# Patient Record
Sex: Male | Born: 1940 | Race: White | Hispanic: No | Marital: Married | Smoking: Never smoker
Health system: Southern US, Community
[De-identification: ages and names within clinical notes are randomized; demographics above are authoritative.]

## PROBLEM LIST (undated history)

## (undated) DIAGNOSIS — E785 Hyperlipidemia, unspecified: Secondary | ICD-10-CM

## (undated) DIAGNOSIS — G2581 Restless legs syndrome: Secondary | ICD-10-CM

## (undated) DIAGNOSIS — K219 Gastro-esophageal reflux disease without esophagitis: Secondary | ICD-10-CM

## (undated) DIAGNOSIS — N400 Enlarged prostate without lower urinary tract symptoms: Secondary | ICD-10-CM

## (undated) DIAGNOSIS — I10 Essential (primary) hypertension: Secondary | ICD-10-CM

## (undated) HISTORY — PX: NASAL FRACTURE SURGERY: SHX718

## (undated) HISTORY — DX: Restless legs syndrome: G25.81

## (undated) HISTORY — PX: OTHER SURGICAL HISTORY: SHX169

## (undated) HISTORY — DX: Essential (primary) hypertension: I10

## (undated) HISTORY — PX: APPENDECTOMY: SHX54

## (undated) HISTORY — DX: Hyperlipidemia, unspecified: E78.5

## (undated) HISTORY — DX: Benign prostatic hyperplasia without lower urinary tract symptoms: N40.0

## (undated) HISTORY — DX: Gastro-esophageal reflux disease without esophagitis: K21.9

---

## 2021-04-05 DIAGNOSIS — Z79899 Other long term (current) drug therapy: Secondary | ICD-10-CM | POA: Diagnosis not present

## 2021-04-05 DIAGNOSIS — E785 Hyperlipidemia, unspecified: Secondary | ICD-10-CM | POA: Diagnosis not present

## 2021-04-05 DIAGNOSIS — Z1212 Encounter for screening for malignant neoplasm of rectum: Secondary | ICD-10-CM | POA: Diagnosis not present

## 2021-04-05 DIAGNOSIS — Z23 Encounter for immunization: Secondary | ICD-10-CM | POA: Diagnosis not present

## 2021-04-05 DIAGNOSIS — K219 Gastro-esophageal reflux disease without esophagitis: Secondary | ICD-10-CM | POA: Diagnosis not present

## 2021-04-05 DIAGNOSIS — G2581 Restless legs syndrome: Secondary | ICD-10-CM | POA: Diagnosis not present

## 2021-04-05 DIAGNOSIS — I10 Essential (primary) hypertension: Secondary | ICD-10-CM | POA: Diagnosis not present

## 2021-04-05 DIAGNOSIS — Z7689 Persons encountering health services in other specified circumstances: Secondary | ICD-10-CM | POA: Diagnosis not present

## 2021-04-05 DIAGNOSIS — Z1211 Encounter for screening for malignant neoplasm of colon: Secondary | ICD-10-CM | POA: Diagnosis not present

## 2021-04-08 DIAGNOSIS — Z23 Encounter for immunization: Secondary | ICD-10-CM | POA: Diagnosis not present

## 2021-06-02 DIAGNOSIS — I10 Essential (primary) hypertension: Secondary | ICD-10-CM | POA: Diagnosis not present

## 2021-07-07 DIAGNOSIS — D3132 Benign neoplasm of left choroid: Secondary | ICD-10-CM | POA: Diagnosis not present

## 2021-07-07 DIAGNOSIS — H33311 Horseshoe tear of retina without detachment, right eye: Secondary | ICD-10-CM | POA: Diagnosis not present

## 2021-07-15 DIAGNOSIS — R42 Dizziness and giddiness: Secondary | ICD-10-CM | POA: Diagnosis not present

## 2021-07-15 DIAGNOSIS — R0982 Postnasal drip: Secondary | ICD-10-CM | POA: Diagnosis not present

## 2021-07-15 DIAGNOSIS — I10 Essential (primary) hypertension: Secondary | ICD-10-CM | POA: Diagnosis not present

## 2021-07-25 DIAGNOSIS — R42 Dizziness and giddiness: Secondary | ICD-10-CM | POA: Diagnosis not present

## 2021-07-25 DIAGNOSIS — H8113 Benign paroxysmal vertigo, bilateral: Secondary | ICD-10-CM | POA: Diagnosis not present

## 2021-08-04 ENCOUNTER — Ambulatory Visit: Payer: Medicare Other | Attending: Internal Medicine | Admitting: Physical Therapy

## 2021-08-04 ENCOUNTER — Other Ambulatory Visit: Payer: Self-pay

## 2021-08-04 DIAGNOSIS — R2681 Unsteadiness on feet: Secondary | ICD-10-CM | POA: Diagnosis not present

## 2021-08-04 DIAGNOSIS — R2689 Other abnormalities of gait and mobility: Secondary | ICD-10-CM | POA: Insufficient documentation

## 2021-08-04 DIAGNOSIS — H8113 Benign paroxysmal vertigo, bilateral: Secondary | ICD-10-CM | POA: Insufficient documentation

## 2021-08-04 DIAGNOSIS — R42 Dizziness and giddiness: Secondary | ICD-10-CM

## 2021-08-05 ENCOUNTER — Encounter: Payer: Self-pay | Admitting: Physical Therapy

## 2021-08-05 NOTE — Therapy (Signed)
[place], [place], [address] Phone: [phone]   Fax:  [phone]  Physical Therapy Evaluation  Patient Details  Name: David Dominguez MRN: [id number] Date of Birth: Oct 26, 1940 Referring Provider (PT): Charlane Ferretti, MD   Encounter Date: 08/04/2021   PT End of Session - 08/05/21 0946     Visit Number 1    Number of Visits 16    Date for PT Re-Evaluation 09/30/21    Authorization Type [employer] Medicare    Progress Note Due on Visit 10    PT Start Time 1405    PT Stop Time 1450    PT Time Calculation (min) 45 min    Activity Tolerance Patient tolerated treatment well    Behavior During Therapy [facility] for tasks assessed/performed             History reviewed. No pertinent past medical history.  History reviewed. No pertinent surgical history.  There were no vitals filed for this visit.    Subjective Assessment - 08/04/21 1405     Subjective Pt reports he was working under his truck laying on his back in July when he became nauseated. A few months after he experienced the same feeling while putting together his bed also in a similar position. He notes it has become more regular now. Pt notes feeling dizzy/sick to his stomach. He has not passed out. Pt notes the world doesn't feel like it's spinning but he can feel a slight headache and nausea. Pt was given exercises by his PCP (states it's mostly laying down with head turning). Pt states he experienced it mostly recently while going down the hall coming into the clinic. He feels it happens mostly while looking up. Pt notes it doesn't happen Dominguez day but depends on activity (for example he felt it when looking back and forth between 2 computer screens)    Pertinent History "vertigo" in the past    Limitations Sitting;Standing;Walking    How long can you sit comfortably? n/a    How long can you stand comfortably? n/a    How long can you walk  comfortably? n/a    Patient Stated Goals Improve nausea and headache feeling    Currently in Pain? No/denies                [facility] PT Assessment - 08/05/21 0001       Assessment   Medical Diagnosis H81.13 (ICD-10-CM) - Benign paroxysmal vertigo, bilateral    Referring Provider (PT) Charlane Ferretti, MD    Onset Date/Surgical Date --   July 2022   Prior Therapy None      Precautions   Precautions Fall      Restrictions   Weight Bearing Restrictions No      Balance Screen   Has the patient fallen in the past 6 months No      [place] residence    Living Arrangements Spouse/significant other    Available Help at Discharge Family      Prior Function   Vocation Retired      Observation/Other Assessments   Focus on Therapeutic Outcomes (FOTO)  n/a      High Level Balance   High Level Balance Comments mCTSIB: condition 1-3: 30 sec. Condition 4 anterior sway touch assist to regain balance ~20 sec  Vestibular Assessment - 08/05/21 0001       Vestibular Assessment   General Observation Reports increased general nausea since coming in through hallway      Symptom Behavior   Subjective history of current problem Pt saw opthamologist and eyes had no issues.    Type of Dizziness  "Funny feeling in head"    Frequency of Dizziness Depends on activity level that day; could be at least 1-3 times during the day    Duration of Dizziness When he quits doing what he's doing it will stop; a few seconds but nausea can linger    Symptom Nature Motion provoked;Positional    Aggravating Factors Looking up to the ceiling;Forward bending;Turning head quickly;Turning body quickly    Relieving Factors Rest;Slow movements    Progression of Symptoms Worse      Oculomotor Exam   Oculomotor Alignment Normal    Ocular ROM WFL    Spontaneous Absent    Gaze-induced  Absent    Head shaking Horizontal Absent    Head Shaking  Vertical Absent    Smooth Pursuits Intact    Saccades Intact      Oculomotor Exam-Fixation Suppressed    Left Head Impulse WNL    Right Head Impulse WNL      Vestibulo-Ocular Reflex   VOR 1 Head Only (x 1 viewing) Increased dizziness with horizontal head turns    VOR 2 Head and Object (x 2 viewing) WNL    VOR to Slow Head Movement Comment   Reports mild dizziness   VOR Cancellation Normal      Positional Testing   Dix-Hallpike Dix-Hallpike Right;Dix-Hallpike Left    Sidelying Test Sidelying Right;Sidelying Left      Dix-Hallpike Right   Dix-Hallpike Right Duration 0      Dix-Hallpike Left   Dix-Hallpike Left Duration 0      Sidelying Right   Sidelying Right Duration 0      Sidelying Left   Sidelying Left Duration 0      Positional Sensitivities   Sit to Supine No dizziness    Supine to Left Side No dizziness    Supine to Right Side No dizziness    Supine to Sitting Lightheadedness    Right Hallpike Lightheadedness    Up from Right Hallpike Lightheadedness    Up from Left Hallpike Lightheadedness    Nose to Right Knee No dizziness    Right Knee to Sitting No dizziness    Nose to Left Knee No dizziness    Left Knee to Sitting No dizziness    Head Turning x 5 No dizziness    Head Nodding x 5 Lightheadedness    Positional Sensitivities Comments Pt reports mild nausea all throughout                Objective measurements completed on examination: See above findings.                PT Education - 08/05/21 0945     Education Details Discussed exam findings, motion sensitivity, and POC. Pt is commuting from [place] -- discussed other clinics that may be more convenient for him such as IT consultant or Neuro rehab on [address].    Person(s) Educated Patient    Methods Explanation;Demonstration;Tactile cues;Verbal cues    Comprehension Verbalized understanding;Returned demonstration;Verbal cues required;Tactile cues required;Need further instruction               PT Short Term Goals - 08/05/21 8341  PT SHORT TERM GOAL #1   Title Pt will be independent with initial HEP    Time 4    Period Weeks    Status New    Target Date 09/02/21      PT SHORT TERM GOAL #2   Title PT will assess FGA and create appropriate goal    Time 4    Period Weeks    Status New    Target Date 09/02/21      PT SHORT TERM GOAL #3   Title Pt will be able to maintain balance on foam with eyes closed x 30 sec to demo improved vestibular integration for mCTSIB    Time 4    Period Weeks    Status New    Target Date 09/02/21      PT SHORT TERM GOAL #4   Title Pt will report at least 50% decrease in his dizziness/nausea    Time 4    Period Weeks    Status New    Target Date 09/02/21               PT Long Term Goals - 08/05/21 1001       PT LONG TERM GOAL #1   Title Pt will be independent with final HEP    Time 8    Period Weeks    Status New    Target Date 09/30/21      PT LONG TERM GOAL #2   Title Pt will demo at least 26/30 on FGA for decreased fall risk    Time 8    Period Weeks    Status New    Target Date 09/30/21      PT LONG TERM GOAL #3   Title Pt will report 0/5 motion sensitivity in the assessed positions on vestibular assessment    Time 8    Period Weeks    Status New    Target Date 09/30/21      PT LONG TERM GOAL #4   Title Pt will report nausea/dizziness has returned to his PLOF    Time 8    Period Weeks    Status New    Target Date 09/30/21                    Plan - 08/05/21 0947     Clinical Impression Statement David Dominguez is an 81 y/o M presenting to OPPT due to complaint of dizziness. Pt reports no prior history of neck pain but does report he had "vertigo" before with a spinning sensation. Does not report spinning but more a sick/nauseous feeling with certain movements. All canalith testing for BPPV was (-) for any nystagmus. Pt demos more motion sensitivity with decreased vestibular  integration with balance and body movement. Pt would highly benefit from PT to address these deficits for return to PLOF. Discussed transfer to [facility] clinic as this may be more convenient for him and his wife.    Personal Factors and Comorbidities Age;Fitness;Time since onset of injury/illness/exacerbation    Examination-Activity Limitations Locomotion Level;Bed Mobility;Bend;Stand;Sit;Transfers    Examination-Participation Restrictions Cleaning;Community Activity;Yard Work;Other    Stability/Clinical Decision Making Stable/Uncomplicated    Clinical Decision Making Low    Rehab Potential Good    PT Frequency 2x / week    PT Duration 8 weeks    PT Treatment/Interventions ADLs/Self Care Home Management;Aquatic Therapy;Electrical Stimulation;Cryotherapy;Iontophoresis 4mg /ml Dexamethasone;Moist Heat;Gait training;Stair training;Functional mobility training;Therapeutic activities;Therapeutic exercise;Balance training;Neuromuscular re-education;Manual techniques;Patient/family education;Dry needling;Passive range of motion;Taping;Vestibular;Spinal  Manipulations    PT Next Visit Plan Please initiate habituation exercises for motion sensitivity (consider Nestor Lewandowsky and Germaine Pomfret), work on oculomotor functions and initiate VOR. Assess FGA if able.    Consulted and Agree with Plan of Care Patient             Patient will benefit from skilled therapeutic intervention in order to improve the following deficits and impairments:  Dizziness, Decreased coordination, Decreased activity tolerance, Decreased balance, Decreased mobility  Visit Diagnosis: Dizziness and giddiness  Unsteadiness on feet  Other abnormalities of gait and mobility     Problem List There are no problems to display for this patient.   [facility] [address], PT, DPT 08/05/2021, 10:06 AM  [facility] [place], [place], [address] Phone:  [phone]   Fax:  [phone]  Name: David Dominguez MRN: [id number] Date of Birth: January 11, 1941

## 2021-08-15 DIAGNOSIS — Z09 Encounter for follow-up examination after completed treatment for conditions other than malignant neoplasm: Secondary | ICD-10-CM | POA: Diagnosis not present

## 2021-09-14 ENCOUNTER — Ambulatory Visit
Admission: RE | Admit: 2021-09-14 | Discharge: 2021-09-14 | Disposition: A | Discharge status: Home / Self Care | Payer: Medicare Other | Source: Ambulatory Visit | Attending: Internal Medicine | Admitting: Internal Medicine

## 2021-09-14 ENCOUNTER — Other Ambulatory Visit: Payer: Self-pay | Admitting: Internal Medicine

## 2021-09-14 DIAGNOSIS — I498 Other specified cardiac arrhythmias: Secondary | ICD-10-CM | POA: Diagnosis not present

## 2021-09-14 DIAGNOSIS — R221 Localized swelling, mass and lump, neck: Secondary | ICD-10-CM | POA: Diagnosis not present

## 2021-09-14 DIAGNOSIS — M47812 Spondylosis without myelopathy or radiculopathy, cervical region: Secondary | ICD-10-CM | POA: Diagnosis not present

## 2021-09-14 DIAGNOSIS — M5412 Radiculopathy, cervical region: Secondary | ICD-10-CM

## 2021-09-14 DIAGNOSIS — M4802 Spinal stenosis, cervical region: Secondary | ICD-10-CM | POA: Diagnosis not present

## 2021-09-14 DIAGNOSIS — R001 Bradycardia, unspecified: Secondary | ICD-10-CM | POA: Diagnosis not present

## 2021-09-14 DIAGNOSIS — R42 Dizziness and giddiness: Secondary | ICD-10-CM | POA: Diagnosis not present

## 2021-09-14 DIAGNOSIS — R200 Anesthesia of skin: Secondary | ICD-10-CM | POA: Diagnosis not present

## 2021-09-30 DIAGNOSIS — I498 Other specified cardiac arrhythmias: Secondary | ICD-10-CM | POA: Diagnosis not present

## 2021-09-30 DIAGNOSIS — M5412 Radiculopathy, cervical region: Secondary | ICD-10-CM | POA: Diagnosis not present

## 2021-09-30 DIAGNOSIS — R001 Bradycardia, unspecified: Secondary | ICD-10-CM | POA: Diagnosis not present

## 2021-10-01 NOTE — Progress Notes (Signed)
?  ?Cardiology Office Note ? ? ?Date:  10/06/2021  ? ?ID:  David Dominguez, DOB Apr 10, 1941, MRN [id number] ? ?PCP:  David Ferretti, MD  ?Cardiologist:   David Nabers Martinique, MD  ? ?Chief Complaint  ?Patient presents with  ? pvc  ? ? ?  ?History of Present Illness: ?David Dominguez is a 81 y.o. male who is seen at the request of David Dominguez for evaluation of ventricular bigeminy. He is a very pleasant male who moved to [facility] from [place] last year. He reports being in good health. Recently he noted pulse rate was 34. Seen by PCP and Ecg showed frequent PVCs with bigeminy. He was completely asymptomatic. No dizziness, palpitations, chest pain, or SOB. He is very active doing yard work. He does note his BP has been fluctuating up and down on his BP monitor. He does have a history of HTN and HLD.  ? ?Past Medical History:  ?Diagnosis Date  ? BPH (benign prostatic hyperplasia)   ? GERD (gastroesophageal reflux disease)   ? Hyperlipidemia   ? Hypertension   ? Restless leg syndrome   ? ? ?Past Surgical History:  ?Procedure Laterality Date  ? APPENDECTOMY    ? esophageal hernia procedure    ? NASAL FRACTURE SURGERY    ? ? ? ?Current Outpatient Medications  ?Medication Sig Dispense Refill  ? amLODipine (NORVASC) 5 MG tablet Take 1 tablet (5 mg total) by mouth daily. 90 tablet 3  ? finasteride (PROSCAR) 5 MG tablet Take 5 mg by mouth daily.    ? gabapentin (NEURONTIN) 100 MG capsule Take 100 mg by mouth 3 (three) times daily.    ? lisinopril-hydrochlorothiazide (ZESTORETIC) 20-25 MG tablet Take 1 tablet by mouth daily.    ? omeprazole (PRILOSEC) 10 MG capsule Take 10 mg by mouth daily.    ? rosuvastatin (CRESTOR) 5 MG tablet Take 5 mg by mouth daily.    ? ?No current facility-administered medications for this visit.  ? ? ?Allergies:   Patient has no allergy information on record.  ? ? ?Social History:  The patient  reports that he has never smoked. He has never used smokeless tobacco. He reports that he does not drink alcohol.  ? ?Family  History:  The patient's family history includes Alzheimer's disease (age of onset: 55) in his father; Heart disease in his brother; Stroke (age of onset: 61) in his mother.  ? ? ?ROS:  Please see the history of present illness.   Otherwise, review of systems are positive for none.   All other systems are reviewed and negative.  ? ? ?PHYSICAL EXAM: ?VS:  BP 134/72   Pulse 60   Ht '5\' 10"'$  (1.778 m)   Wt 220 lb 9.6 oz (100.1 kg)   SpO2 98%   BMI 31.65 kg/m?  , BMI Body mass index is 31.65 kg/m?. ?GEN: Well nourished, well developed, in no acute distress ?HEENT: normal ?Neck: no JVD, carotid bruits, or masses ?Cardiac: RRR; no murmurs, rubs, or gallops,no edema  ?Respiratory:  clear to auscultation bilaterally, normal work of breathing ?GI: soft, nontender, nondistended, + BS ?MS: no deformity or atrophy ?Skin: warm and dry, no rash ?Neuro:  Strength and sensation are intact ?Psych: euthymic mood, full affect ? ? ?EKG:  EKG is ordered today. ?The ekg ordered today demonstrates NSR with occ PVC. Rate 60. Otherwise normal. I have personally reviewed and interpreted this study. ? ? ? ?Recent Labs: ?No results found for requested labs within last  8760 hours.  ? ?Dated 04/05/21: cholesterol 140, triglycerides 64, HDL 68, nonHDL 72. CBC and LFTs normal.  ? ?Lipid Panel ?No results found for: CHOL, TRIG, HDL, CHOLHDL, VLDL, LDLCALC, LDLDIRECT ?  ? ?Wt Readings from Last 3 Encounters:  ?10/06/21 220 lb 9.6 oz (100.1 kg)  ?  ? ? ?Other studies Reviewed: ?Additional studies/ records that were reviewed today include:  ? ?Echo dated 09/30/21: moderate LVH, EF 70%. Mild LAE. 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 Otherwise normal.  ? ? ?ASSESSMENT AND PLAN: ? ?1.  PVCs. Asymptomatic. Normal Electrolytes. Normal LV function. No further evaluation or treatment needed. I think his low pulse rate is a false reading due to undersensing PVCs. This may also explain some variability of BP readings on automatic monitor.  ?2. HTN. I have recommended  increasing amlodipine to 5 mg daily. Continue lisinopril HCT ?3. HLD on statin. ? ? ?Current medicines are reviewed at length with the patient today.  The patient does not have concerns regarding medicines. ? ?The following changes have been made:  increase amlodipine to 5 mg daily ? ?Labs/ tests ordered today include:  ? ?Orders Placed This Encounter  ?Procedures  ? EKG 12-Lead  ? ? ?   ? ? ?Disposition:   FU PRN ? ?Signed, ?David Ober Martinique, MD  ?10/06/2021 1:02 PM    ?[place] ?[address], [place], [place], [address] ?Phone [phone], Fax [phone] ? ? ?

## 2021-10-06 ENCOUNTER — Encounter: Payer: Self-pay | Admitting: Cardiology

## 2021-10-06 ENCOUNTER — Ambulatory Visit: Payer: Medicare Other | Admitting: Cardiology

## 2021-10-06 VITALS — BP 134/72 | HR 60 | Ht 70.0 in | Wt 220.6 lb

## 2021-10-06 DIAGNOSIS — I10 Essential (primary) hypertension: Secondary | ICD-10-CM

## 2021-10-06 DIAGNOSIS — I493 Ventricular premature depolarization: Secondary | ICD-10-CM | POA: Diagnosis not present

## 2021-10-06 MED ORDER — AMLODIPINE BESYLATE 5 MG PO TABS: 5.0000 mg | ORAL_TABLET | Freq: Every day | ORAL | 3 refills | Status: AC

## 2021-11-23 DIAGNOSIS — R21 Rash and other nonspecific skin eruption: Secondary | ICD-10-CM | POA: Diagnosis not present

## 2021-11-23 DIAGNOSIS — L821 Other seborrheic keratosis: Secondary | ICD-10-CM | POA: Diagnosis not present

## 2021-11-23 DIAGNOSIS — L988 Other specified disorders of the skin and subcutaneous tissue: Secondary | ICD-10-CM | POA: Diagnosis not present

## 2021-12-01 DIAGNOSIS — K219 Gastro-esophageal reflux disease without esophagitis: Secondary | ICD-10-CM | POA: Diagnosis not present

## 2021-12-01 DIAGNOSIS — I10 Essential (primary) hypertension: Secondary | ICD-10-CM | POA: Diagnosis not present

## 2021-12-01 DIAGNOSIS — E785 Hyperlipidemia, unspecified: Secondary | ICD-10-CM | POA: Diagnosis not present

## 2021-12-05 DIAGNOSIS — L570 Actinic keratosis: Secondary | ICD-10-CM | POA: Diagnosis not present

## 2021-12-05 DIAGNOSIS — L578 Other skin changes due to chronic exposure to nonionizing radiation: Secondary | ICD-10-CM | POA: Diagnosis not present

## 2021-12-05 DIAGNOSIS — L739 Follicular disorder, unspecified: Secondary | ICD-10-CM | POA: Diagnosis not present

## 2021-12-05 DIAGNOSIS — D485 Neoplasm of uncertain behavior of skin: Secondary | ICD-10-CM | POA: Diagnosis not present

## 2022-01-05 DIAGNOSIS — D3132 Benign neoplasm of left choroid: Secondary | ICD-10-CM | POA: Diagnosis not present

## 2022-01-05 DIAGNOSIS — H33311 Horseshoe tear of retina without detachment, right eye: Secondary | ICD-10-CM | POA: Diagnosis not present

## 2022-01-16 DIAGNOSIS — L300 Nummular dermatitis: Secondary | ICD-10-CM | POA: Diagnosis not present

## 2022-01-16 DIAGNOSIS — B86 Scabies: Secondary | ICD-10-CM | POA: Diagnosis not present

## 2022-01-16 DIAGNOSIS — L309 Dermatitis, unspecified: Secondary | ICD-10-CM | POA: Diagnosis not present

## 2022-01-16 DIAGNOSIS — D485 Neoplasm of uncertain behavior of skin: Secondary | ICD-10-CM | POA: Diagnosis not present

## 2022-02-28 DIAGNOSIS — L820 Inflamed seborrheic keratosis: Secondary | ICD-10-CM | POA: Diagnosis not present

## 2022-02-28 DIAGNOSIS — L570 Actinic keratosis: Secondary | ICD-10-CM | POA: Diagnosis not present

## 2022-03-08 DIAGNOSIS — Z09 Encounter for follow-up examination after completed treatment for conditions other than malignant neoplasm: Secondary | ICD-10-CM | POA: Diagnosis not present

## 2022-03-08 DIAGNOSIS — Z86010 Personal history of colonic polyps: Secondary | ICD-10-CM | POA: Diagnosis not present

## 2022-03-08 DIAGNOSIS — D123 Benign neoplasm of transverse colon: Secondary | ICD-10-CM | POA: Diagnosis not present

## 2022-03-08 DIAGNOSIS — K648 Other hemorrhoids: Secondary | ICD-10-CM | POA: Diagnosis not present

## 2022-03-08 DIAGNOSIS — K5730 Diverticulosis of large intestine without perforation or abscess without bleeding: Secondary | ICD-10-CM | POA: Diagnosis not present

## 2022-03-10 DIAGNOSIS — D123 Benign neoplasm of transverse colon: Secondary | ICD-10-CM | POA: Diagnosis not present

## 2022-05-10 DIAGNOSIS — K219 Gastro-esophageal reflux disease without esophagitis: Secondary | ICD-10-CM | POA: Diagnosis not present

## 2022-05-10 DIAGNOSIS — Z79899 Other long term (current) drug therapy: Secondary | ICD-10-CM | POA: Diagnosis not present

## 2022-05-10 DIAGNOSIS — Z0000 Encounter for general adult medical examination without abnormal findings: Secondary | ICD-10-CM | POA: Diagnosis not present

## 2022-05-10 DIAGNOSIS — E785 Hyperlipidemia, unspecified: Secondary | ICD-10-CM | POA: Diagnosis not present

## 2022-05-10 DIAGNOSIS — G2581 Restless legs syndrome: Secondary | ICD-10-CM | POA: Diagnosis not present

## 2022-05-10 DIAGNOSIS — I10 Essential (primary) hypertension: Secondary | ICD-10-CM | POA: Diagnosis not present

## 2022-05-10 DIAGNOSIS — Z23 Encounter for immunization: Secondary | ICD-10-CM | POA: Diagnosis not present

## 2022-08-07 DIAGNOSIS — R600 Localized edema: Secondary | ICD-10-CM | POA: Diagnosis not present

## 2022-08-23 DIAGNOSIS — Z03818 Encounter for observation for suspected exposure to other biological agents ruled out: Secondary | ICD-10-CM | POA: Diagnosis not present

## 2022-08-23 DIAGNOSIS — R0981 Nasal congestion: Secondary | ICD-10-CM | POA: Diagnosis not present

## 2022-09-25 IMAGING — CR DG CERVICAL SPINE COMP WITH FLEX & EXTEND
7 series · 7 of 7 positions shown · non-contrast
Comparison: None.

CLINICAL DATA: Neck pain, bilateral arm numbness.

EXAM:
CERVICAL SPINE COMPLETE WITH FLEXION AND EXTENSION VIEWS

[w c-spine lat (1 of 3)]
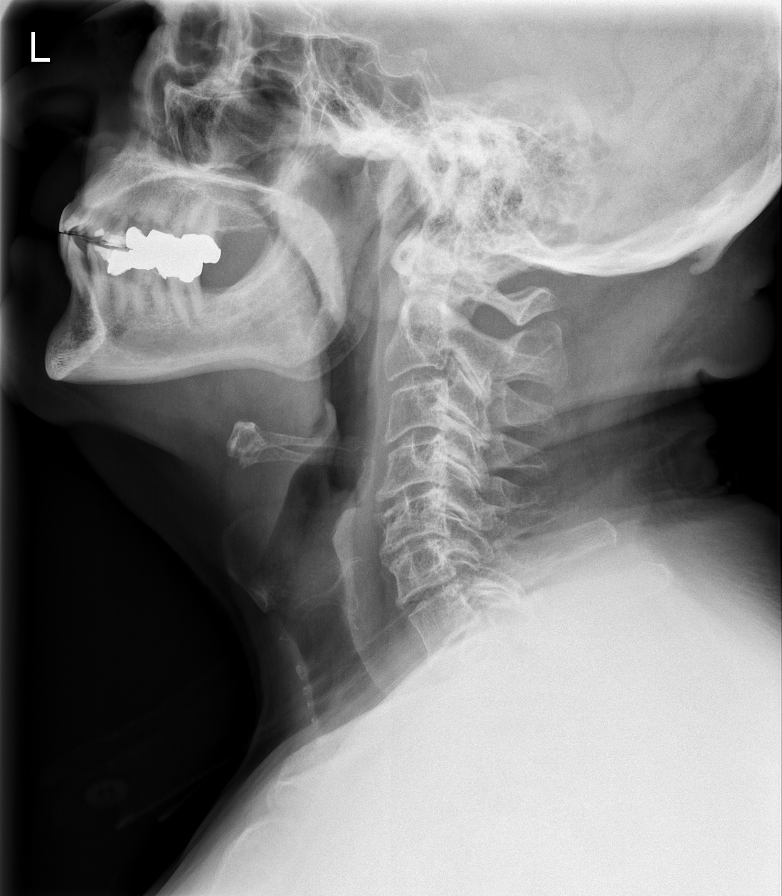

[w c-spine lat (2 of 3)]
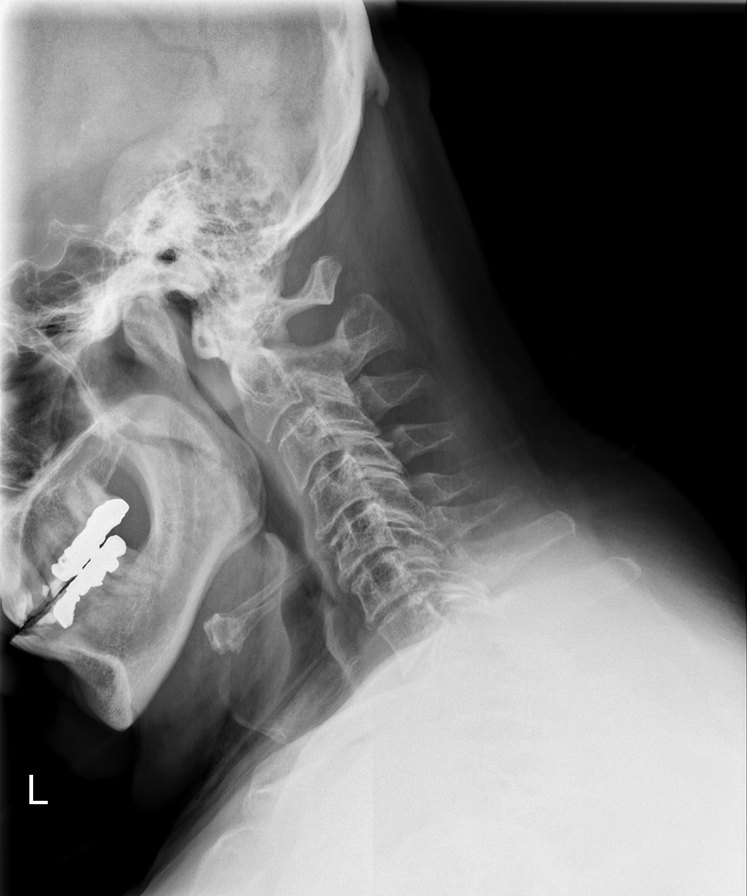

[w c-spine lat (3 of 3)]
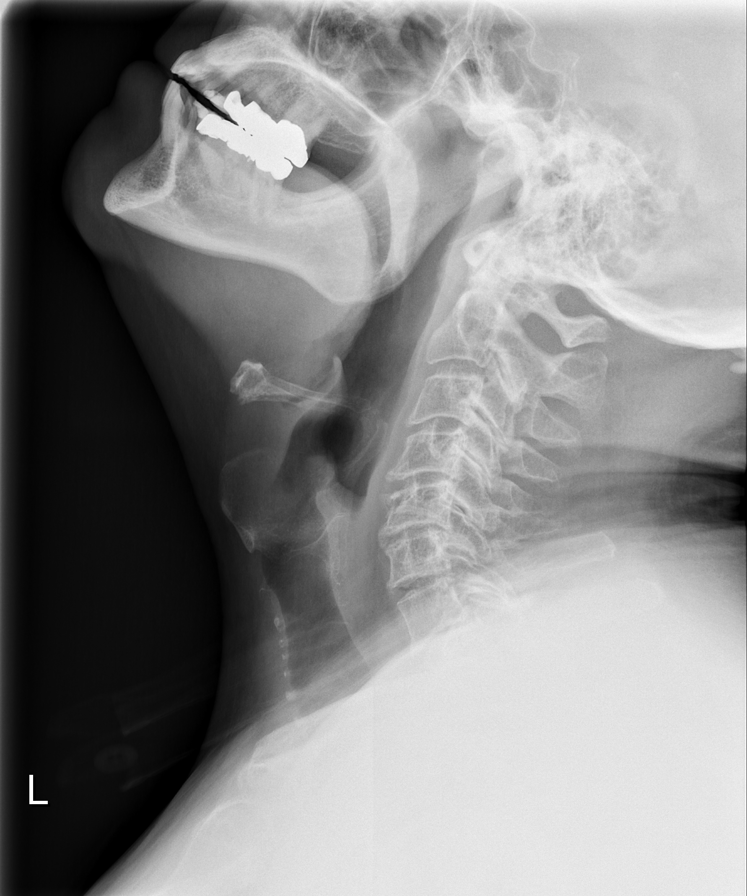

[w c-spine oblique (1 of 2)]
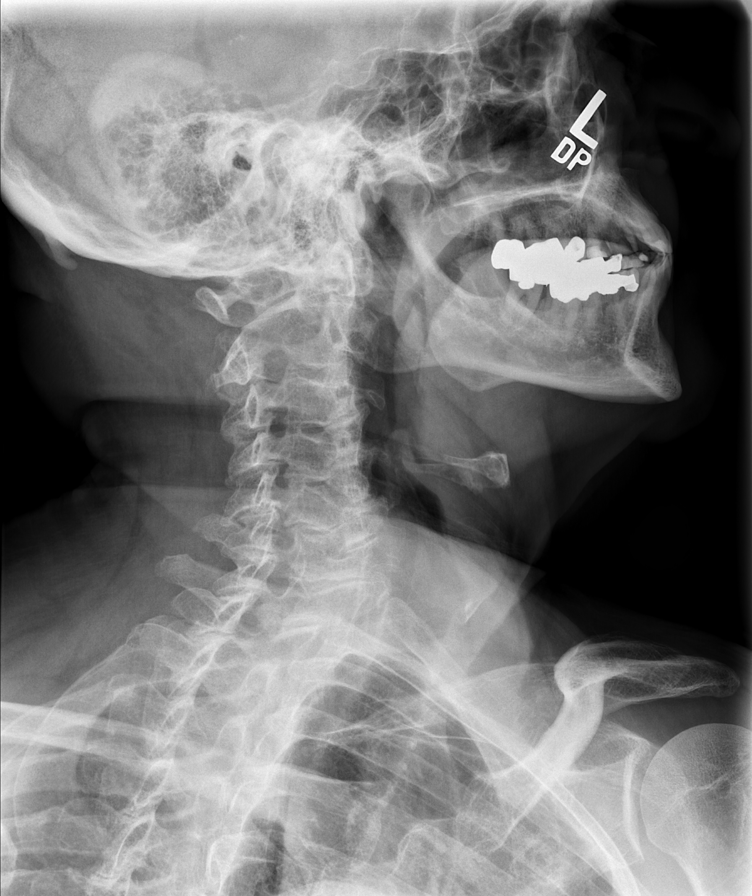

[w c-spine oblique (2 of 2)]
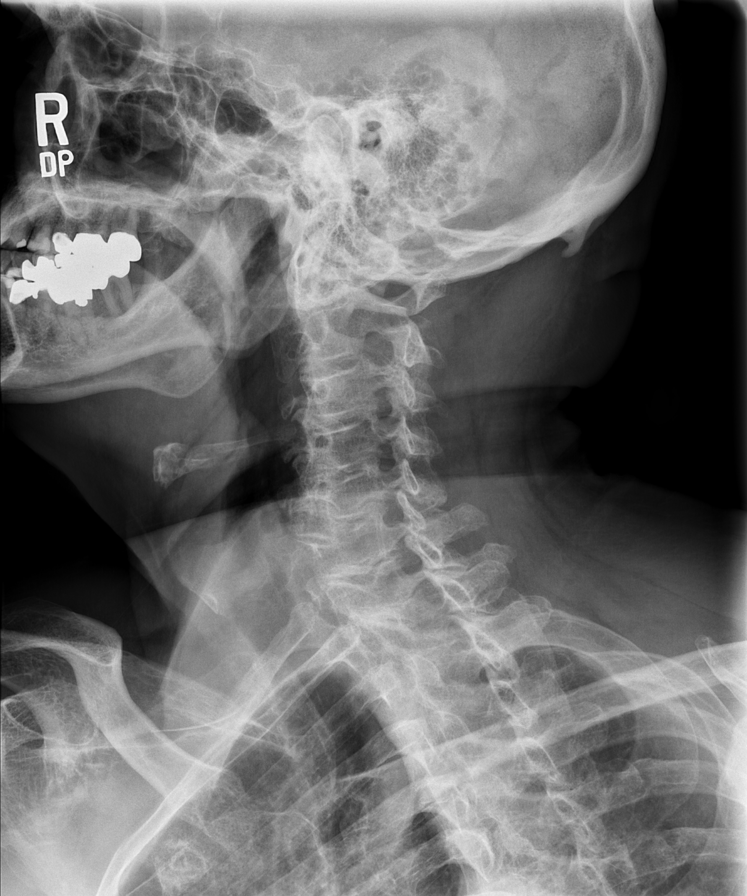

[w c-spine a.p. *]
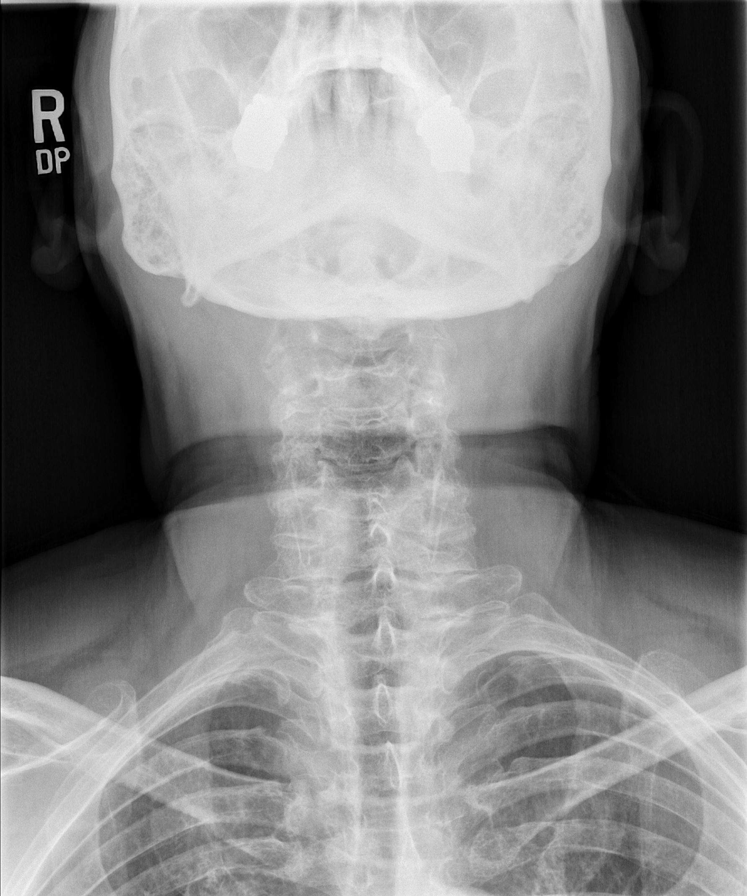

[w c-spine odontoid *]
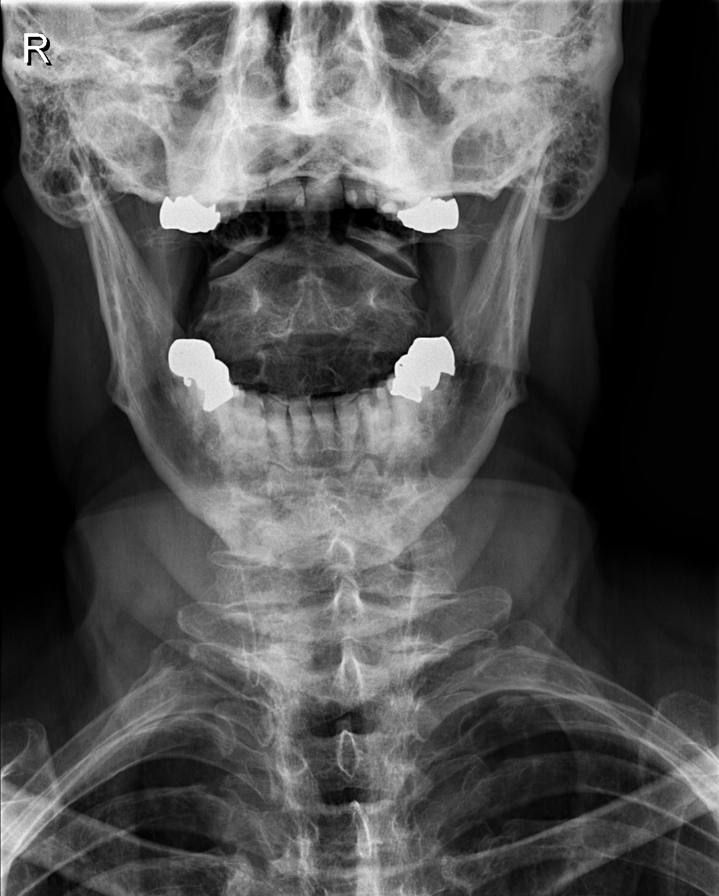

[7 of 7 positions shown; findings below may reference images not displayed]

FINDINGS: On the lateral view the cervical spine is visualized to the level of
C7. Normal cervical lordosis. No change in alignment on flexion and
extension views.

Dens is well positioned between the lateral masses of C1. There is
limited evaluation of the dens for acute fracture on the open-mouth
view due to overlying osseous structures.

Multilevel moderate degenerative changes of the spine most prominent
at the C4 through C6 levels. Associated at least moderate osseous
neural foraminal stenosis at the C4-C5 level. No acute displaced
fracture is detected.No aggressive-appearing focal osseous lesions.

Pre-vertebral soft tissues are within normal limits.
IMPRESSION: 1. No acute displaced fracture or traumatic listhesis of the
cervical spine.
2. At least moderate osseous neural foraminal stenosis at the C4-C5
level due to degenerative changes.

## 2022-10-30 DIAGNOSIS — W57XXXA Bitten or stung by nonvenomous insect and other nonvenomous arthropods, initial encounter: Secondary | ICD-10-CM | POA: Diagnosis not present

## 2022-10-30 DIAGNOSIS — R21 Rash and other nonspecific skin eruption: Secondary | ICD-10-CM | POA: Diagnosis not present

## 2022-11-06 DIAGNOSIS — I10 Essential (primary) hypertension: Secondary | ICD-10-CM | POA: Diagnosis not present

## 2022-11-06 DIAGNOSIS — E785 Hyperlipidemia, unspecified: Secondary | ICD-10-CM | POA: Diagnosis not present

## 2022-11-08 DIAGNOSIS — L509 Urticaria, unspecified: Secondary | ICD-10-CM | POA: Diagnosis not present

## 2022-11-08 DIAGNOSIS — L570 Actinic keratosis: Secondary | ICD-10-CM | POA: Diagnosis not present

## 2022-11-08 DIAGNOSIS — L309 Dermatitis, unspecified: Secondary | ICD-10-CM | POA: Diagnosis not present

## 2022-11-28 DIAGNOSIS — L989 Disorder of the skin and subcutaneous tissue, unspecified: Secondary | ICD-10-CM | POA: Diagnosis not present

## 2022-11-28 DIAGNOSIS — W57XXXA Bitten or stung by nonvenomous insect and other nonvenomous arthropods, initial encounter: Secondary | ICD-10-CM | POA: Diagnosis not present

## 2022-11-28 DIAGNOSIS — D485 Neoplasm of uncertain behavior of skin: Secondary | ICD-10-CM | POA: Diagnosis not present

## 2022-11-30 DIAGNOSIS — S70361D Insect bite (nonvenomous), right thigh, subsequent encounter: Secondary | ICD-10-CM | POA: Diagnosis not present

## 2022-12-27 ENCOUNTER — Ambulatory Visit: Payer: Medicare Other | Admitting: Internal Medicine

## 2023-01-11 DIAGNOSIS — D3132 Benign neoplasm of left choroid: Secondary | ICD-10-CM | POA: Diagnosis not present

## 2023-01-15 DIAGNOSIS — D485 Neoplasm of uncertain behavior of skin: Secondary | ICD-10-CM | POA: Diagnosis not present

## 2023-01-15 DIAGNOSIS — L988 Other specified disorders of the skin and subcutaneous tissue: Secondary | ICD-10-CM | POA: Diagnosis not present

## 2023-01-15 DIAGNOSIS — S20469D Insect bite (nonvenomous) of unspecified back wall of thorax, subsequent encounter: Secondary | ICD-10-CM | POA: Diagnosis not present

## 2023-01-15 DIAGNOSIS — W57XXXA Bitten or stung by nonvenomous insect and other nonvenomous arthropods, initial encounter: Secondary | ICD-10-CM | POA: Diagnosis not present

## 2023-02-14 DIAGNOSIS — L309 Dermatitis, unspecified: Secondary | ICD-10-CM | POA: Diagnosis not present

## 2023-02-14 DIAGNOSIS — W57XXXD Bitten or stung by nonvenomous insect and other nonvenomous arthropods, subsequent encounter: Secondary | ICD-10-CM | POA: Diagnosis not present

## 2023-03-05 DIAGNOSIS — L309 Dermatitis, unspecified: Secondary | ICD-10-CM | POA: Diagnosis not present

## 2023-03-05 DIAGNOSIS — L578 Other skin changes due to chronic exposure to nonionizing radiation: Secondary | ICD-10-CM | POA: Diagnosis not present

## 2023-03-05 DIAGNOSIS — L814 Other melanin hyperpigmentation: Secondary | ICD-10-CM | POA: Diagnosis not present

## 2023-03-05 DIAGNOSIS — L821 Other seborrheic keratosis: Secondary | ICD-10-CM | POA: Diagnosis not present

## 2023-03-05 DIAGNOSIS — D229 Melanocytic nevi, unspecified: Secondary | ICD-10-CM | POA: Diagnosis not present

## 2023-03-05 DIAGNOSIS — D1801 Hemangioma of skin and subcutaneous tissue: Secondary | ICD-10-CM | POA: Diagnosis not present

## 2023-03-05 DIAGNOSIS — L570 Actinic keratosis: Secondary | ICD-10-CM | POA: Diagnosis not present

## 2023-04-06 DIAGNOSIS — Z79899 Other long term (current) drug therapy: Secondary | ICD-10-CM | POA: Diagnosis not present

## 2023-05-16 DIAGNOSIS — Z79899 Other long term (current) drug therapy: Secondary | ICD-10-CM | POA: Diagnosis not present

## 2023-05-16 DIAGNOSIS — Z0000 Encounter for general adult medical examination without abnormal findings: Secondary | ICD-10-CM | POA: Diagnosis not present

## 2023-05-16 DIAGNOSIS — K219 Gastro-esophageal reflux disease without esophagitis: Secondary | ICD-10-CM | POA: Diagnosis not present

## 2023-05-16 DIAGNOSIS — E785 Hyperlipidemia, unspecified: Secondary | ICD-10-CM | POA: Diagnosis not present

## 2023-05-16 DIAGNOSIS — G2581 Restless legs syndrome: Secondary | ICD-10-CM | POA: Diagnosis not present

## 2023-05-16 DIAGNOSIS — H547 Unspecified visual loss: Secondary | ICD-10-CM | POA: Diagnosis not present

## 2023-05-16 DIAGNOSIS — I10 Essential (primary) hypertension: Secondary | ICD-10-CM | POA: Diagnosis not present

## 2023-07-19 DIAGNOSIS — W57XXXD Bitten or stung by nonvenomous insect and other nonvenomous arthropods, subsequent encounter: Secondary | ICD-10-CM | POA: Diagnosis not present

## 2023-07-19 DIAGNOSIS — L309 Dermatitis, unspecified: Secondary | ICD-10-CM | POA: Diagnosis not present

## 2023-07-19 DIAGNOSIS — Z79899 Other long term (current) drug therapy: Secondary | ICD-10-CM | POA: Diagnosis not present

## 2023-09-27 DIAGNOSIS — D0439 Carcinoma in situ of skin of other parts of face: Secondary | ICD-10-CM | POA: Diagnosis not present

## 2023-09-27 DIAGNOSIS — L814 Other melanin hyperpigmentation: Secondary | ICD-10-CM | POA: Diagnosis not present

## 2023-09-27 DIAGNOSIS — D485 Neoplasm of uncertain behavior of skin: Secondary | ICD-10-CM | POA: Diagnosis not present

## 2023-10-11 DIAGNOSIS — C44329 Squamous cell carcinoma of skin of other parts of face: Secondary | ICD-10-CM | POA: Diagnosis not present

## 2023-11-01 DIAGNOSIS — Z961 Presence of intraocular lens: Secondary | ICD-10-CM | POA: Diagnosis not present

## 2023-11-01 DIAGNOSIS — H11153 Pinguecula, bilateral: Secondary | ICD-10-CM | POA: Diagnosis not present

## 2023-11-01 DIAGNOSIS — H33311 Horseshoe tear of retina without detachment, right eye: Secondary | ICD-10-CM | POA: Diagnosis not present

## 2023-11-01 DIAGNOSIS — H524 Presbyopia: Secondary | ICD-10-CM | POA: Diagnosis not present

## 2023-11-01 DIAGNOSIS — H5202 Hypermetropia, left eye: Secondary | ICD-10-CM | POA: Diagnosis not present

## 2023-11-01 DIAGNOSIS — H52203 Unspecified astigmatism, bilateral: Secondary | ICD-10-CM | POA: Diagnosis not present

## 2023-11-01 DIAGNOSIS — H348322 Tributary (branch) retinal vein occlusion, left eye, stable: Secondary | ICD-10-CM | POA: Diagnosis not present

## 2023-11-08 DIAGNOSIS — S80862D Insect bite (nonvenomous), left lower leg, subsequent encounter: Secondary | ICD-10-CM | POA: Diagnosis not present

## 2023-11-08 DIAGNOSIS — L309 Dermatitis, unspecified: Secondary | ICD-10-CM | POA: Diagnosis not present

## 2023-11-19 DIAGNOSIS — J988 Other specified respiratory disorders: Secondary | ICD-10-CM | POA: Diagnosis not present

## 2023-11-19 DIAGNOSIS — K219 Gastro-esophageal reflux disease without esophagitis: Secondary | ICD-10-CM | POA: Diagnosis not present

## 2023-11-19 DIAGNOSIS — E785 Hyperlipidemia, unspecified: Secondary | ICD-10-CM | POA: Diagnosis not present

## 2023-11-19 DIAGNOSIS — I10 Essential (primary) hypertension: Secondary | ICD-10-CM | POA: Diagnosis not present

## 2024-01-28 NOTE — Progress Notes (Signed)
 Adult prophylaxis [facility] 01/28/24  Dental procedures in this visit  . I8889 - PROPHYLAXIS - ADULT (Completed)    Service provider: Shawnee Herald, [facility]    Billing provider: Selinda Benne, DDS  . I8793 - TOPICAL APPLICATION OF FLUORIDE VARNISH (Completed)    Service provider: Shawnee Herald, [facility]    Billing provider: Selinda Benne, DDS   HEALTH HISTORY ? Vitals:  BP Readings from Last 1 Encounters:  01/28/24 115/73  Pulse:  66  Medical history was reviewed and updated. No contraindication to care.  Medical History[1] Surgical History[2] Social History[3] Family History[4] Medications Ordered Prior to Encounter[5]  Medical Risk Assessment ASA GRADE: ASA 2 - Patient with mild systemic disease with no functional limitations  Subjective: Patient reports no change to condition since last visit.   Objective:  Radiographs taken: No radiographs captured at this appointment .  Periodontal Evaluation Periodontal Charting: Updated full mouth periodontal charting and documented all findings in patient's tooth chart: Yes Radiographic studies show mild generalized horizontal bone loss  Periodontal findings:  Tissue color: Pink, consistency: fibrotic CAL: 2-9 mm Bleeding: Less than 30% of sites, localized Furcation involvements: 2,3,14,15,18,19,30,31 Mobility: None Calculus: Light supragingival plaque and calculus Patient oral hygiene: Good Patient's oral hygiene is better since last visit  Assessment: Periodontal Diagnosis: recession throughout.  Risks and potential complications: tooth mobility, loss, sensitivity. Informed patient of all periodontal findings. Informed patient of recommended treatment procedure(s) based on findings. Patient confirmed they understood and has no additional questions.  Plan:  handscale, floss, polish with Nupro coarse paste, Fluoride varnish (5% Sodium Fluoride) placed at end of appointment  OHI: Reviewed brushing and flossing  technique.  Additional Notes: recommended/ Dispersed: Proxabrush, endtuft brush, rubber tip stimulator, act mouth wash. Stressed proxabrush 1st, than brush and floss, finish with mouthwash, recommended RTS daily and to use Endtuft brush if need to UL by molar if plaque noticed with using RTS in furcation areas. Pt is currently not interested in Perio Referral.   Recommended recall interval: 4 months.  Next Visit: Recall/maintenance  Treatment Providers Shawnee Herald, [facility] - [facility] [address] [facility] [place] [address] [phone]       [1] Past Medical History: Diagnosis Date  . Hypertension   [2] History reviewed. No pertinent surgical history. [3] Social History Tobacco Use  . Smoking status: Never  . Smokeless tobacco: Never  Substance Use Topics  . Alcohol use: Never  . Drug use: Never  [4] No family history on file. [5] Current Outpatient Medications on File Prior to Visit  Medication Sig Dispense Refill  . amLODIPine  (NORVASC ) 5 mg tablet     . amLODIPine  besylate, bulk, 100 % powder amLODIPine  Besylate    . ascorbic acid (Vitamin C) 1,000 mg tablet Vitamin C    . azithromycin (ZITHROMAX) 250 mg tablet 2 TABLETS ON DAY 1 FOLLOWED BY 1 TABLET EACH ON DAYS 2-5 ORALLY ONCE A DAY 5 DAYS    . beneprotein Protein    . D3-E-Se-soy isofl-tocoph-lycop (Prostate 2.4) 1,200-15-35 unit-unit-mcg capsule Prostate    . diphenhydramine HCl (ANTIHISTAMINE ALLERGY ORAL) Antihistamine    . emoll comb. no.46-sunscreen 30 SPF cream 1 (one) time each day at the same time.    . finasteride (PROSCAR) 5 mg tablet Take 5 mg by mouth in the morning.    . folic acid (FOLVITE) 1 mg tablet     . gabapentin (NEURONTIN) 100 mg capsule     .  gabapentin 10 % cream, metered-dose applicator Gabapentin    . ketoconazole 1 % shampoo Ketoconazole    . lidocaine/benzalkonium chlorid (A+D CRACKED SKIN RELIEF TOP) A+D Cracked Skin Relief    . lisinopriL  10 mg tablet 10 mg, hydroCHLOROthiazide 25 mg tablet 25 mg Lisinopril-hydroCHLOROthiazide    . lisinopriL-hydrochlorothiazide (PRINZIDE,ZESTORETIC) 20-25 mg tablet     . methotrexate 2.5 mg tablet     . multivit with minerals/lutein (MULTIVITAMIN 50 PLUS ORAL) Multivitamin    . omeprazole (PriLOSEC) 20 mg DR capsule     . omeprazole-sodium bicarbonate 2-84 mg/mL suspension for reconstitution Omeprazole    . rosuvastatin (CRESTOR) 5 mg tablet     . rosuvastatin (Crestor) 5 mg tablet Rosuvastatin Calcium    . triamcinolone  (KENALOG) 0.1 % cream 1 Application every 12 (twelve) hours.    . triamcinolone -gauze-silicone 0.1 %- 4 X 4 kit Triamcinolone     . TURMERIC-GINGER-BLACK PEPPER ORAL Turmeric-Ginger    . vitamin D3-vitamin K2 1,250-200 mcg capsule Vitamin D3     No current facility-administered medications on file prior to visit.

## 2024-03-05 DIAGNOSIS — L814 Other melanin hyperpigmentation: Secondary | ICD-10-CM | POA: Diagnosis not present

## 2024-03-05 DIAGNOSIS — L309 Dermatitis, unspecified: Secondary | ICD-10-CM | POA: Diagnosis not present

## 2024-03-05 DIAGNOSIS — L821 Other seborrheic keratosis: Secondary | ICD-10-CM | POA: Diagnosis not present

## 2024-03-05 DIAGNOSIS — W57XXXD Bitten or stung by nonvenomous insect and other nonvenomous arthropods, subsequent encounter: Secondary | ICD-10-CM | POA: Diagnosis not present

## 2024-03-05 DIAGNOSIS — L570 Actinic keratosis: Secondary | ICD-10-CM | POA: Diagnosis not present

## 2024-03-05 DIAGNOSIS — D229 Melanocytic nevi, unspecified: Secondary | ICD-10-CM | POA: Diagnosis not present

## 2024-03-05 DIAGNOSIS — Z79899 Other long term (current) drug therapy: Secondary | ICD-10-CM | POA: Diagnosis not present

## 2024-03-05 DIAGNOSIS — L578 Other skin changes due to chronic exposure to nonionizing radiation: Secondary | ICD-10-CM | POA: Diagnosis not present
